# Patient Record
Sex: Male | Born: 2005 | Marital: Single | Smoking: Never smoker
Health system: Southern US, Community
[De-identification: ages and names within clinical notes are randomized; demographics above are authoritative.]

## PROBLEM LIST (undated history)

## (undated) DIAGNOSIS — Z8489 Family history of other specified conditions: Secondary | ICD-10-CM

## (undated) DIAGNOSIS — S83519A Sprain of anterior cruciate ligament of unspecified knee, initial encounter: Secondary | ICD-10-CM

## (undated) DIAGNOSIS — K219 Gastro-esophageal reflux disease without esophagitis: Secondary | ICD-10-CM

## (undated) DIAGNOSIS — J45909 Unspecified asthma, uncomplicated: Secondary | ICD-10-CM

## (undated) HISTORY — PX: ADENOIDECTOMY, TONSILLECTOMY AND MYRINGOTOMY WITH TUBE PLACEMENT: SHX5716

## (undated) HISTORY — PX: TONSILLECTOMY: SUR1361

## (undated) HISTORY — DX: Gastro-esophageal reflux disease without esophagitis: K21.9

## (undated) HISTORY — DX: Family history of other specified conditions: Z84.89

## (undated) HISTORY — DX: Sprain of anterior cruciate ligament of unspecified knee, initial encounter: S83.519A

---
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
## 2024-01-14 ENCOUNTER — Other Ambulatory Visit: Payer: Self-pay | Admitting: Family Medicine

## 2024-01-14 ENCOUNTER — Ambulatory Visit
Admission: RE | Admit: 2024-01-14 | Discharge: 2024-01-14 | Disposition: A | Discharge status: Home / Self Care | Attending: Family Medicine | Admitting: Family Medicine

## 2024-01-14 ENCOUNTER — Ambulatory Visit
Admission: RE | Admit: 2024-01-14 | Discharge: 2024-01-14 | Disposition: A | Discharge status: Home / Self Care | Source: Ambulatory Visit | Attending: Family Medicine | Admitting: Family Medicine

## 2024-01-14 DIAGNOSIS — S6710XA Crushing injury of unspecified finger(s), initial encounter: Secondary | ICD-10-CM

## 2024-02-07 ENCOUNTER — Emergency Department

## 2024-02-07 ENCOUNTER — Other Ambulatory Visit: Payer: Self-pay

## 2024-02-07 ENCOUNTER — Encounter: Payer: Self-pay | Admitting: Emergency Medicine

## 2024-02-07 ENCOUNTER — Emergency Department
Admission: EM | Admit: 2024-02-07 | Discharge: 2024-02-07 | Disposition: A | Discharge status: Home / Self Care | Attending: Emergency Medicine | Admitting: Emergency Medicine

## 2024-02-07 DIAGNOSIS — M25561 Pain in right knee: Secondary | ICD-10-CM

## 2024-02-07 DIAGNOSIS — W19XXXA Unspecified fall, initial encounter: Secondary | ICD-10-CM | POA: Diagnosis not present

## 2024-02-07 DIAGNOSIS — M25562 Pain in left knee: Secondary | ICD-10-CM | POA: Diagnosis present

## 2024-02-07 DIAGNOSIS — J45909 Unspecified asthma, uncomplicated: Secondary | ICD-10-CM | POA: Diagnosis not present

## 2024-02-07 DIAGNOSIS — Y990 Civilian activity done for income or pay: Secondary | ICD-10-CM | POA: Insufficient documentation

## 2024-02-07 DIAGNOSIS — Y9301 Activity, walking, marching and hiking: Secondary | ICD-10-CM | POA: Diagnosis not present

## 2024-02-07 MED ORDER — IBUPROFEN 800 MG PO TABS: 800.0000 mg | ORAL_TABLET | Freq: Three times a day (TID) | ORAL | 0 refills | Status: AC | PRN

## 2024-02-07 MED ORDER — OXYCODONE-ACETAMINOPHEN 5-325 MG PO TABS
1.0000 | ORAL_TABLET | Freq: Once | ORAL | Status: AC
  Administered 2024-02-07: 1 via ORAL
  Filled 2024-02-07: qty 1

## 2024-02-07 MED ORDER — OXYCODONE-ACETAMINOPHEN 5-325 MG PO TABS: 1.0000 | ORAL_TABLET | Freq: Three times a day (TID) | ORAL | 0 refills | Status: AC | PRN

## 2024-02-07 NOTE — ED Provider Notes (Signed)
 [facility] Provider Note    Event Date/Time   First MD Initiated Contact with Patient 02/07/24 2039     (approximate)   History   Knee Pain   HPI  Eduardo Wells is a 18 y.o. male  with a past medical history of left ACL reconstruction with autograft, anxiety, asthma, GERD presents to the emergency department with left knee pain after injury.  Patient states he was playing basketball yesterday when the medial side of his left knee started hurting.  Then, he was at work today and stated his knee buckled and he fell down afterwards but did not fall onto the knee.  He has been able to bear minimal weight since his pain started.  He denies any other injuries at this time.  He denies hitting his head, erythema, edema, fever, numbness, tingling, or any wound to the knee.  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    Physical Exam   Triage Vital Signs: ED Triage Vitals  Encounter Vitals Group     BP 02/07/24 2020 (!) 140/78     Girls Systolic BP Percentile --      Girls Diastolic BP Percentile --      Boys Systolic BP Percentile --      Boys Diastolic BP Percentile --      Pulse Rate 02/07/24 2020 68     Resp 02/07/24 2020 17     Temp 02/07/24 2020 98.4 F (36.9 C)     Temp Source 02/07/24 2020 Oral     SpO2 02/07/24 2020 100 %     Weight 02/07/24 2020 140 lb (63.5 kg)     Height 02/07/24 2020 5' 8 (1.727 m)     Head Circumference --      Peak Flow --      Pain Score 02/07/24 2024 9     Pain Loc --      Pain Education --      Exclude from Growth Chart --     Most recent vital signs: Vitals:   02/07/24 2020 02/07/24 2141  BP: (!) 140/78 137/77  Pulse: 68 68  Resp: 17 17  Temp: 98.4 F (36.9 C) 98.4 F (36.9 C)  SpO2: 100% 100%    General: Awake. Tearful on exam. Head: Normocephalic, atraumatic. CV: Good peripheral perfusion. DP and PT pulses 2+ b/l. Respiratory:Normal respiratory effort.  No respiratory distress.  GI: Soft,  non-distended. MSK: Extremely TTP along medial and posterior aspects of left knee. Will not let me perform McMurray, ACL or menisci testing on this knee due to pain. Guarding his knee when palpated. Is able to flex and extend his b/l knees on his own. Skin:Warm, dry, intact. No rashes, lesions, or ecchymosis. No cyanosis or pallor. No erythema or increased warmth.  ED Results / Procedures / Treatments   Labs (all labs ordered are listed, but only abnormal results are displayed) Labs Reviewed - No data to display   EKG     RADIOLOGY X ray left knee.  FINDINGS: ACL repair surgical changes noted. No prior radiography to compare stable positioning of surgical hardware. No evidence of fracture, dislocation, or joint effusion. No evidence of arthropathy or other focal bone abnormality. Soft tissues are unremarkable.   IMPRESSION: 1.  No acute displaced fracture or dislocation. 2. ACL repair surgical changes.   PROCEDURES:  Critical Care performed: No   Procedures   MEDICATIONS ORDERED IN ED: Medications  oxyCODONE -acetaminophen  (PERCOCET/ROXICET) 5-325 MG per  tablet 1 tablet (1 tablet Oral Given 02/07/24 2121)     IMPRESSION / MDM / ASSESSMENT AND PLAN / ED COURSE  I reviewed the triage vital signs and the nursing notes.                              Differential diagnosis includes, but is not limited to, ligamentous injury, meniscal injury, patellar fracture, distal femur vs proximal tibial or fibular fracture  Patient's presentation is most consistent with acute complicated illness / injury requiring diagnostic workup.  Patient is a 19 year old male with signs and symptoms as described above.  Patient is neurovascularly intact, able to range his knee actively, but would like to forego ligamentous and meniscal testing at this time due to his intense pain.  He was able to ambulate to the facility and after his initial injury at home.  X-ray of left knee was ordered and  without any acute displaced fracture or dislocation.  I independently viewed the x-ray and radiologist's report.  I agree with the radiologist's report.  I suspect he may have reinjured his ACL or have a meniscal tear.  Did recommend ibuprofen  for his pain and will give him a minimal amount of Percocet.  PDMP reviewed prior to providing Percocet prescription. Discussed that this will be an outpatient follow-up with orthopedics and they will do further/more extensive imaging there to evaluate his current knee pain/injury.  Did provide him with crutches and a supportive knee brace to provide stability and ensure he is nonweightbearing until he can follow-up with orthopedics.    The patient may return to the emergency department for any new, worsening, or concerning symptoms. Patient was given the opportunity to ask questions; all questions were answered. Emergency department return precautions were discussed with the patient.  Patient is in agreement to the treatment plan.  Patient is stable for discharge.    FINAL CLINICAL IMPRESSION(S) / ED DIAGNOSES   Final diagnoses:  Acute pain of right knee     Rx / DC Orders   ED Discharge Orders          Ordered    oxyCODONE -acetaminophen  (PERCOCET) 5-325 MG tablet  Every 8 hours PRN        02/07/24 2133    ibuprofen  (ADVIL ) 800 MG tablet  Every 8 hours PRN        02/07/24 2133             Note:  This document was prepared using Dragon voice recognition software and may include unintentional dictation errors.     Sheron Salm, PA-C 02/07/24 2228    Viviann Pastor, MD 02/07/24 [phone]

## 2024-02-07 NOTE — Discharge Instructions (Addendum)
 You are seen in the emergency department for right knee pain.  Your x-ray did not show a fracture or dislocation.  I will have you follow-up with an orthopedist outpatient because I believe this may be an injury to your ligament like your ACL or your meniscus.  Please pick up and take the pain medication as prescribed.  Please use the crutches and knee immobilizer at all times until you are reevaluated by orthopedics.  Please also follow-up with your primary care provider if you have 1.  If you do not have 1 I have provided a list of resources for you.  Please do not work, drive, make legal binding decisions, or climb on ladders/heights while taking pain medication.  Please follow-up with the orthopedist listed in this paperwork.

## 2024-02-07 NOTE — ED Triage Notes (Signed)
 Pt to ED from work c/o left knee injury.  States was walking at [employer] to get supplies and left knee buckled.  Previous ACL injury with surgery on left knee. Pain is medial.  Denies other injuries or pain.  Pt states not filing this as WC.

## 2024-02-15 ENCOUNTER — Other Ambulatory Visit: Payer: Self-pay | Admitting: Orthopedic Surgery

## 2024-02-15 DIAGNOSIS — Z9889 Other specified postprocedural states: Secondary | ICD-10-CM

## 2024-02-15 DIAGNOSIS — M2392 Unspecified internal derangement of left knee: Secondary | ICD-10-CM

## 2024-02-15 DIAGNOSIS — M25462 Effusion, left knee: Secondary | ICD-10-CM

## 2024-02-23 ENCOUNTER — Ambulatory Visit
Admission: RE | Admit: 2024-02-23 | Discharge: 2024-02-23 | Disposition: A | Discharge status: Home / Self Care | Source: Ambulatory Visit | Attending: Orthopedic Surgery | Admitting: Orthopedic Surgery

## 2024-02-23 DIAGNOSIS — M2392 Unspecified internal derangement of left knee: Secondary | ICD-10-CM

## 2024-02-23 DIAGNOSIS — M25462 Effusion, left knee: Secondary | ICD-10-CM

## 2024-02-23 DIAGNOSIS — Z9889 Other specified postprocedural states: Secondary | ICD-10-CM
